# Patient Record
Sex: Female | Born: 1935 | Race: White | Hispanic: No | Smoking: Never smoker
Health system: Southern US, Community
[De-identification: ages and names within clinical notes are randomized; demographics above are authoritative.]

## PROBLEM LIST (undated history)

## (undated) DIAGNOSIS — E119 Type 2 diabetes mellitus without complications: Secondary | ICD-10-CM

## (undated) DIAGNOSIS — I2510 Atherosclerotic heart disease of native coronary artery without angina pectoris: Secondary | ICD-10-CM

## (undated) DIAGNOSIS — M1990 Unspecified osteoarthritis, unspecified site: Secondary | ICD-10-CM

## (undated) DIAGNOSIS — E079 Disorder of thyroid, unspecified: Secondary | ICD-10-CM

## (undated) DIAGNOSIS — I10 Essential (primary) hypertension: Secondary | ICD-10-CM

---

## 2013-08-02 ENCOUNTER — Emergency Department (HOSPITAL_COMMUNITY): Payer: Medicare Other

## 2013-08-02 ENCOUNTER — Emergency Department (HOSPITAL_COMMUNITY)
Admission: EM | Admit: 2013-08-02 | Discharge: 2013-08-02 | Disposition: A | Discharge status: Skilled Nursing Facility | Payer: Medicare Other | Attending: Emergency Medicine | Admitting: Emergency Medicine

## 2013-08-02 DIAGNOSIS — Y9210 Unspecified residential institution as the place of occurrence of the external cause: Secondary | ICD-10-CM | POA: Insufficient documentation

## 2013-08-02 DIAGNOSIS — W19XXXA Unspecified fall, initial encounter: Secondary | ICD-10-CM

## 2013-08-02 DIAGNOSIS — S01409A Unspecified open wound of unspecified cheek and temporomandibular area, initial encounter: Secondary | ICD-10-CM | POA: Insufficient documentation

## 2013-08-02 DIAGNOSIS — Y92129 Unspecified place in nursing home as the place of occurrence of the external cause: Secondary | ICD-10-CM

## 2013-08-02 DIAGNOSIS — W06XXXA Fall from bed, initial encounter: Secondary | ICD-10-CM | POA: Insufficient documentation

## 2013-08-02 DIAGNOSIS — Z7982 Long term (current) use of aspirin: Secondary | ICD-10-CM | POA: Insufficient documentation

## 2013-08-02 DIAGNOSIS — IMO0002 Reserved for concepts with insufficient information to code with codable children: Secondary | ICD-10-CM

## 2013-08-02 DIAGNOSIS — Y939 Activity, unspecified: Secondary | ICD-10-CM | POA: Insufficient documentation

## 2013-08-02 DIAGNOSIS — S51009A Unspecified open wound of unspecified elbow, initial encounter: Secondary | ICD-10-CM | POA: Insufficient documentation

## 2013-08-02 DIAGNOSIS — S50312A Abrasion of left elbow, initial encounter: Secondary | ICD-10-CM

## 2013-08-02 DIAGNOSIS — Z79899 Other long term (current) drug therapy: Secondary | ICD-10-CM | POA: Insufficient documentation

## 2013-08-02 DIAGNOSIS — Z794 Long term (current) use of insulin: Secondary | ICD-10-CM | POA: Insufficient documentation

## 2013-08-02 DIAGNOSIS — F0390 Unspecified dementia without behavioral disturbance: Secondary | ICD-10-CM | POA: Insufficient documentation

## 2013-08-02 NOTE — ED Provider Notes (Signed)
CSN: [id number]     Arrival date & time 08/02/13  0221 History   First MD Initiated Contact with Patient 08/02/13 0234     Chief Complaint  Patient presents with  . Fall   (Consider location/radiation/quality/duration/timing/severity/associated sxs/prior Treatment) HPI 80 yo female presents to the ER from nursing home via EMS with reported fall.  Pt reports sliding off the bed, striking her face on the way down, scraping her elbow.  No blood thinners, no reported LOC.  Patient has dementia, is a poor historian.  Patient has skin tear to right cheek abrasion to left elbow.  Family at the bedside, reports she is at her baseline. No past medical history on file. No past surgical history on file. No family history on file. History  Substance Use Topics  . Smoking status: Not on file  . Smokeless tobacco: Not on file  . Alcohol Use: Not on file   OB History   No data available     Review of Systems  Unable to perform ROS: Dementia    Allergies  Lodine  Home Medications   Current Outpatient Rx  Name  Route  Sig  Dispense  Refill  . Artificial Tear Ointment (LACRI-LUBE OP)   Ophthalmic   Apply 1 application to eye 4 (four) times daily. APPLY SMALL AMOUNT OF OINTMENT TO RIGHT EYE AND LOWER LID OF EYE 4 TIMES DAILY         . aspirin 81 MG chewable tablet   Oral   Chew 81 mg by mouth daily.         . carboxymethylcellulose (REFRESH TEARS) 0.5 % SOLN   Both Eyes   Place 1 drop into both eyes 2 (two) times daily.         . cholecalciferol (VITAMIN D) 1000 UNITS tablet   Oral   Take 1,000 Units by mouth daily.         . ciprofloxacin (CIPRO) 250 MG tablet   Oral   Take 250 mg by mouth 2 (two) times daily.         Marland Kitchen docusate sodium (COLACE) 100 MG capsule   Oral   Take 100 mg by mouth daily.         . feeding supplement, GLUCERNA SHAKE, (GLUCERNA SHAKE) LIQD   Oral   Take 237 mLs by mouth 2 (two) times daily between meals.         . insulin detemir  (LEVEMIR) 100 UNIT/ML injection   Subcutaneous   Inject 10 Units into the skin every evening.         Marland Kitchen levothyroxine (SYNTHROID, LEVOTHROID) 100 MCG tablet   Oral   Take 100 mcg by mouth daily before breakfast.         . lisinopril (PRINIVIL,ZESTRIL) 2.5 MG tablet   Oral   Take 2.5 mg by mouth daily.         Marland Kitchen LORazepam (ATIVAN) 0.5 MG tablet   Oral   Take 0.5 mg by mouth every 8 (eight) hours as needed for anxiety.         Marland Kitchen nystatin (MYCOSTATIN/NYSTOP) 100000 UNIT/GM POWD   Topical   Apply topically 2 (two) times daily. Apply to groin twice daily until healed         . risperiDONE (RISPERDAL) 0.25 MG tablet   Oral   Take 0.25 mg by mouth 2 (two) times daily.          BP 134/73  Pulse 75  Temp(Src) 98.3 F (  36.8 C) (Oral)  Resp 18  SpO2 97% Physical Exam  Nursing note and vitals reviewed. Constitutional: She appears well-developed and well-nourished. No distress.  HENT:  Head: Normocephalic.  Right Ear: External ear normal.  Left Ear: External ear normal.  Nose: Nose normal.  Mouth/Throat: Oropharynx is clear and moist.  3 cm, circular, skin tear to right cheek.  Bleeding is controlled  Eyes: Conjunctivae and EOM are normal. Pupils are equal, round, and reactive to light.  Neck: Normal range of motion. Neck supple. No JVD present. No tracheal deviation present. No thyromegaly present.  Cardiovascular: Normal rate, regular rhythm, normal heart sounds and intact distal pulses.  Exam reveals no gallop and no friction rub.   No murmur heard. Pulmonary/Chest: Effort normal and breath sounds normal. No stridor. No respiratory distress. She has no wheezes. She has no rales. She exhibits no tenderness.  Abdominal: Soft. Bowel sounds are normal. She exhibits no distension and no mass. There is no tenderness. There is no rebound and no guarding.  Musculoskeletal: Normal range of motion. She exhibits no edema and no tenderness.  Abrasion to left elbow.  Patient  noted to have multiple areas of bruising in various stages in ages  Lymphadenopathy:    She has no cervical adenopathy.  Neurological: She is alert. She has normal reflexes. No cranial nerve deficit. She exhibits normal muscle tone. Coordination normal.  Skin: Skin is warm and dry. No rash noted. No erythema. No pallor.  Psychiatric: She has a normal mood and affect. Her behavior is normal. Judgment and thought content normal.    ED Course  Procedures (including critical care time) Labs Review Labs Reviewed - No data to display Imaging Review Ct Head Wo Contrast  08/02/2013   CLINICAL DATA:  80 year old female status post fall with facial injury. Skin tear. Abrasion. Initial encounter.  EXAM: CT HEAD WITHOUT CONTRAST  CT CERVICAL SPINE WITHOUT CONTRAST  TECHNIQUE: Multidetector CT imaging of the head and cervical spine was performed following the standard protocol without intravenous contrast. Multiplanar CT image reconstructions of the cervical spine were also generated.  COMPARISON:  None.  FINDINGS: CT HEAD FINDINGS  No acute orbits soft tissue findings. No scalp hematoma identified. Calcified atherosclerosis at the skull base. Paranasal sinus periosteal thickening. Left mastoids are opacified. Negative visualized nasopharynx.  10 mm oval calcified probable small hemangioma at the right vertex. No associated mass effect. Dural calcifications elsewhere. Cerebral volume is within normal limits for age. No ventriculomegaly. No intracranial mass effect. Small chronic right thalamic lacunar infarct. No evidence of cortically based acute infarction identified. No suspicious intracranial vascular hyperdensity.  CT CERVICAL SPINE FINDINGS  Straightening and mild reversal of cervical lordosis. Multilevel mild spondylolisthesis in the cervical spine (e.g. C3-C4) is associated with widespread cervical facet degeneration. There is widespread ligament flavum calcification in the mid to lower cervical spine and  continuing into the upper thoracic spine. Subsequently, there is a degree of degenerative lower cervical spinal stenosis, appears to be mild.  Visualized skull base is intact. No atlanto-occipital dissociation. Bilateral posterior element alignment is within normal limits. Trace anterolisthesis of C7 on T1 with associated facet degeneration. Grossly intact visualized upper thoracic levels. Negative lung apices.  Calcified bilateral stylohyoid ligaments. Bulky bilateral carotid bifurcation calcified atherosclerosis. Otherwise negative visualized paraspinal soft tissues.  IMPRESSION: 1. Negative for age non contrast CT appearance of the brain; incidental small right convexity calcified meningioma. 2. No acute fracture or listhesis identified in the cervical spine. Ligamentous injury is not  excluded. 3. Multilevel degenerative changes. Multilevel lower cervical spinal stenosis, in part related to widespread calcified ligament flavum hypertrophy.   Electronically Signed   By: Augusto Gamble M.D.   On: 08/02/2013 04:00   Ct Cervical Spine Wo Contrast  08/02/2013   CLINICAL DATA:  80 year old female status post fall with facial injury. Skin tear. Abrasion. Initial encounter.  EXAM: CT HEAD WITHOUT CONTRAST  CT CERVICAL SPINE WITHOUT CONTRAST  TECHNIQUE: Multidetector CT imaging of the head and cervical spine was performed following the standard protocol without intravenous contrast. Multiplanar CT image reconstructions of the cervical spine were also generated.  COMPARISON:  None.  FINDINGS: CT HEAD FINDINGS  No acute orbits soft tissue findings. No scalp hematoma identified. Calcified atherosclerosis at the skull base. Paranasal sinus periosteal thickening. Left mastoids are opacified. Negative visualized nasopharynx.  10 mm oval calcified probable small hemangioma at the right vertex. No associated mass effect. Dural calcifications elsewhere. Cerebral volume is within normal limits for age. No ventriculomegaly. No  intracranial mass effect. Small chronic right thalamic lacunar infarct. No evidence of cortically based acute infarction identified. No suspicious intracranial vascular hyperdensity.  CT CERVICAL SPINE FINDINGS  Straightening and mild reversal of cervical lordosis. Multilevel mild spondylolisthesis in the cervical spine (e.g. C3-C4) is associated with widespread cervical facet degeneration. There is widespread ligament flavum calcification in the mid to lower cervical spine and continuing into the upper thoracic spine. Subsequently, there is a degree of degenerative lower cervical spinal stenosis, appears to be mild.  Visualized skull base is intact. No atlanto-occipital dissociation. Bilateral posterior element alignment is within normal limits. Trace anterolisthesis of C7 on T1 with associated facet degeneration. Grossly intact visualized upper thoracic levels. Negative lung apices.  Calcified bilateral stylohyoid ligaments. Bulky bilateral carotid bifurcation calcified atherosclerosis. Otherwise negative visualized paraspinal soft tissues.  IMPRESSION: 1. Negative for age non contrast CT appearance of the brain; incidental small right convexity calcified meningioma. 2. No acute fracture or listhesis identified in the cervical spine. Ligamentous injury is not excluded. 3. Multilevel degenerative changes. Multilevel lower cervical spinal stenosis, in part related to widespread calcified ligament flavum hypertrophy.   Electronically Signed   By: Augusto Gamble M.D.   On: 08/02/2013 04:00    EKG Interpretation    Date/Time:  Saturday August 02 2013 02:37:48 EST Ventricular Rate:  89 PR Interval:  190 QRS Duration: 84 QT Interval:  398 QTC Calculation: 484 R Axis:   -46 Text Interpretation:  Sinus rhythm LAD, consider left anterior fascicular block Anterior infarct, old Nonspecific T abnormalities, lateral leads No old tracing to compare Confirmed by Buell Parcel  MD, Rashard Ryle ([id number]) on 08/02/2013 3:24:52 AM             MDM   1. Fall at nursing home   2. Skin tear   3. Abrasion of left elbow     80 year old female status post fall, at her nursing home.  CT scans without acute injury.  Right cheek wound repaired with Steri-Strips.  Dressing applied to left elbow.  Patient is stable for discharge back to her nursing facility.    Olivia Mackie, MD 08/02/13 [phone]

## 2013-08-02 NOTE — Discharge Instructions (Signed)
Fall Prevention in Hospitals As a hospital patient, your condition and the treatments you receive can increase your risk for falls. Some additional risk factors for falls in a hospital include:  Being in an unfamiliar environment.  Being on bed rest.  Your surgery.  Taking certain medicines.  Your tubing requirements, such as intravenous (IV) therapy or catheters. It is important that you learn how to decrease fall risks while at the hospital. Below are important tips that can help prevent falls. SAFETY TIPS FOR PREVENTING FALLS Talk about your risk of falling.  Ask your caregiver why you are at risk for falling. Is it your medicine, illness, tubing placement, or something else?  Make a plan with your caregiver to keep you safe from falls.  Ask your caregiver or pharmacist about side effect of your medicines. Some medicines can make you dizzy or affect your coordination. Ask for help.  Ask for help before getting out of bed. You may need to press your call button.  Ask for assistance in getting you safely to the toilet.  Ask for a walker or cane to be put at your bedside. Ask that most of the side rails on your bed be placed up before your caregiver leaves the room.  Ask family or friends to sit with you.  Ask for things that are out of your reach, such as your glasses, hearing aids, telephone, bedside table, or call button. Follow these tips to avoid falling:  Stay lying or seated, rather than standing, while waiting for help.  Wear rubber-soled slippers or shoes whenever you walk in the hospital.  Avoid quick, sudden movements.  Change positions slowly.  Sit on the side of your bed before standing.  Stand up slowly and wait before you start to walk.  Let your caregiver know if there is a spill on the floor.  Pay careful attention to the medical equipment, electrical cords, and tubes around you.  When you need help, use your call button by your bed or in the  bathroom. Wait for one of your caregivers to help you.  If you feel dizzy or unsure of your footing, return to bed and wait for assistance.  Avoid being distracted by the TV, telephone, or another person in your room.  Do not lean or support yourself on rolling objects, such as IV poles or bedside tables. Document Released: 06/09/2000 Document Revised: 05/29/2012 Document Reviewed: 02/18/2012 [facility] Patient Information 2014 [place], [place].  Stitches, Staples, or Skin Adhesive Strips  Skin adhesive strips hold the skin together as it heals. They will usually be in place for 7 days or less. HOME CARE  Wash your hands with soap and water before and after you touch your wound.  Only take medicine as told by your doctor.  Cover your wound only if your doctor told you to. Otherwise, leave it open to air.  Skin adhesive strips will fall off by themselves.  Do not pick at the wound. Picking can cause an infection.  Do not miss your follow-up appointment.  If you have problems or questions, call your doctor. GET HELP RIGHT AWAY IF:   You have a temperature by mouth above 102 F (38.9 C), not controlled by medicine.  You have chills.  You have redness or pain around your stitches.  There is puffiness (swelling) around your stitches.  You notice fluid (drainage) from your stitches.  There is a bad smell coming from your wound. MAKE SURE YOU:  Understand these instructions.  Will watch your condition.  Will get help if you are not doing well or get worse. Document Released: 04/09/2009 Document Revised: 09/04/2011 Document Reviewed: 04/09/2009 [facility] Patient Information 2014 [place], [place].   Abrasion An abrasion is a cut or scrape of the skin. Abrasions do not extend through all layers of the skin and most heal within 10 days. It is important to care for your abrasion properly to prevent infection. CAUSES  Most abrasions are caused by falling on, or gliding across,  the ground or other surface. When your skin rubs on something, the outer and inner layer of skin rubs off, causing an abrasion. DIAGNOSIS  Your caregiver will be able to diagnose an abrasion during a physical exam.  TREATMENT  Your treatment depends on how large and deep the abrasion is. Generally, your abrasion will be cleaned with water and a mild soap to remove any dirt or debris. An antibiotic ointment may be put over the abrasion to prevent an infection. A bandage (dressing) may be wrapped around the abrasion to keep it from getting dirty.  You may need a tetanus shot if:  You cannot remember when you had your last tetanus shot.  You have never had a tetanus shot.  The injury broke your skin. If you get a tetanus shot, your arm may swell, get red, and feel warm to the touch. This is common and not a problem. If you need a tetanus shot and you choose not to have one, there is a rare chance of getting tetanus. Sickness from tetanus can be serious.  HOME CARE INSTRUCTIONS   If a dressing was applied, change it at least once a day or as directed by your caregiver. If the bandage sticks, soak it off with warm water.   Wash the area with water and a mild soap to remove all the ointment 2 times a day. Rinse off the soap and pat the area dry with a clean towel.   Reapply any ointment as directed by your caregiver. This will help prevent infection and keep the bandage from sticking. Use gauze over the wound and under the dressing to help keep the bandage from sticking.   Change your dressing right away if it becomes wet or dirty.   Only take over-the-counter or prescription medicines for pain, discomfort, or fever as directed by your caregiver.   Follow up with your caregiver within 24 48 hours for a wound check, or as directed. If you were not given a wound-check appointment, look closely at your abrasion for redness, swelling, or pus. These are signs of infection. SEEK IMMEDIATE MEDICAL  CARE IF:   You have increasing pain in the wound.   You have redness, swelling, or tenderness around the wound.   You have pus coming from the wound.   You have a fever or persistent symptoms for more than 2 3 days.  You have a fever and your symptoms suddenly get worse.  You have a bad smell coming from the wound or dressing.  MAKE SURE YOU:   Understand these instructions.  Will watch your condition.  Will get help right away if you are not doing well or get worse. Document Released: 03/22/2005 Document Revised: 05/29/2012 Document Reviewed: 05/16/2011 [facility] Patient Information 2014 [place], [place].

## 2013-08-02 NOTE — ED Notes (Signed)
Patient transported to CT 

## 2013-08-02 NOTE — ED Notes (Signed)
Bed: [id number] Expected date: 08/02/13 Expected time: 2:07 AM Means of arrival: Ambulance Comments: 80 yo F  Fall

## 2013-08-02 NOTE — ED Notes (Addendum)
Per EMS , pt. From an Assisted Living,reported of fall from her bed as she tried to reach out for her walker to go to the bathroom. Pt. Noted with skin tear on her right face, and  redness on her forehead . Pt. Is alert and oriented x2 ,not sure if she hit her head to the floor per report . No LOC reported. Checked for back injury, none noted.Denies of pain. Pt. Is a DNR.

## 2013-08-10 ENCOUNTER — Emergency Department (HOSPITAL_COMMUNITY): Payer: Medicare Other

## 2013-08-10 ENCOUNTER — Emergency Department (HOSPITAL_COMMUNITY)
Admission: EM | Admit: 2013-08-10 | Discharge: 2013-08-10 | Disposition: A | Discharge status: Home / Self Care | Payer: Medicare Other | Attending: Emergency Medicine | Admitting: Emergency Medicine

## 2013-08-10 ENCOUNTER — Encounter (HOSPITAL_COMMUNITY): Payer: Self-pay | Admitting: Emergency Medicine

## 2013-08-10 DIAGNOSIS — I2510 Atherosclerotic heart disease of native coronary artery without angina pectoris: Secondary | ICD-10-CM | POA: Insufficient documentation

## 2013-08-10 DIAGNOSIS — Z79899 Other long term (current) drug therapy: Secondary | ICD-10-CM | POA: Insufficient documentation

## 2013-08-10 DIAGNOSIS — M25519 Pain in unspecified shoulder: Secondary | ICD-10-CM | POA: Insufficient documentation

## 2013-08-10 DIAGNOSIS — S1093XA Contusion of unspecified part of neck, initial encounter: Principal | ICD-10-CM

## 2013-08-10 DIAGNOSIS — E079 Disorder of thyroid, unspecified: Secondary | ICD-10-CM | POA: Insufficient documentation

## 2013-08-10 DIAGNOSIS — Z7982 Long term (current) use of aspirin: Secondary | ICD-10-CM | POA: Insufficient documentation

## 2013-08-10 DIAGNOSIS — I10 Essential (primary) hypertension: Secondary | ICD-10-CM | POA: Insufficient documentation

## 2013-08-10 DIAGNOSIS — S0083XA Contusion of other part of head, initial encounter: Principal | ICD-10-CM

## 2013-08-10 DIAGNOSIS — W1809XA Striking against other object with subsequent fall, initial encounter: Secondary | ICD-10-CM | POA: Insufficient documentation

## 2013-08-10 DIAGNOSIS — S80212A Abrasion, left knee, initial encounter: Secondary | ICD-10-CM

## 2013-08-10 DIAGNOSIS — IMO0002 Reserved for concepts with insufficient information to code with codable children: Secondary | ICD-10-CM | POA: Insufficient documentation

## 2013-08-10 DIAGNOSIS — S0093XA Contusion of unspecified part of head, initial encounter: Secondary | ICD-10-CM

## 2013-08-10 DIAGNOSIS — Y9210 Unspecified residential institution as the place of occurrence of the external cause: Secondary | ICD-10-CM | POA: Insufficient documentation

## 2013-08-10 DIAGNOSIS — Z794 Long term (current) use of insulin: Secondary | ICD-10-CM | POA: Insufficient documentation

## 2013-08-10 DIAGNOSIS — S0003XA Contusion of scalp, initial encounter: Secondary | ICD-10-CM | POA: Insufficient documentation

## 2013-08-10 DIAGNOSIS — E119 Type 2 diabetes mellitus without complications: Secondary | ICD-10-CM | POA: Insufficient documentation

## 2013-08-10 DIAGNOSIS — W19XXXA Unspecified fall, initial encounter: Secondary | ICD-10-CM

## 2013-08-10 DIAGNOSIS — Y939 Activity, unspecified: Secondary | ICD-10-CM | POA: Insufficient documentation

## 2013-08-10 HISTORY — DX: Atherosclerotic heart disease of native coronary artery without angina pectoris: I25.10

## 2013-08-10 HISTORY — DX: Type 2 diabetes mellitus without complications: E11.9

## 2013-08-10 HISTORY — DX: Disorder of thyroid, unspecified: E07.9

## 2013-08-10 HISTORY — DX: Essential (primary) hypertension: I10

## 2013-08-10 LAB — URINALYSIS, ROUTINE W REFLEX MICROSCOPIC
Bilirubin Urine: NEGATIVE
GLUCOSE, UA: 250 mg/dL — AB
Ketones, ur: NEGATIVE mg/dL
Leukocytes, UA: NEGATIVE
Nitrite: NEGATIVE
PH: 6 (ref 5.0–8.0)
PROTEIN: NEGATIVE mg/dL
Specific Gravity, Urine: 1.017 (ref 1.005–1.030)
Urobilinogen, UA: 0.2 mg/dL (ref 0.0–1.0)

## 2013-08-10 LAB — URINE MICROSCOPIC-ADD ON

## 2013-08-10 NOTE — ED Provider Notes (Signed)
CSN: [id number]     Arrival date & time 08/10/13  1804 History   First MD Initiated Contact with Patient 08/10/13 1952     Chief Complaint  Patient presents with  . Fall     (Consider location/radiation/quality/duration/timing/severity/associated sxs/prior Treatment) Patient is a 80 y.o. female presenting with fall.  Fall This is a recurrent problem. The current episode started 12 to 24 hours ago. Episode frequency: twice. The problem has been resolved. Associated symptoms include headaches. Pertinent negatives include no chest pain, no abdominal pain and no shortness of breath. Nothing aggravates the symptoms. Nothing relieves the symptoms.    Past Medical History  Diagnosis Date  . Diabetes mellitus without complication   . Coronary artery disease   . Hypertension   . Thyroid disease    History reviewed. No pertinent past surgical history. No family history on file. History  Substance Use Topics  . Smoking status: Never Smoker   . Smokeless tobacco: Not on file  . Alcohol Use: No   OB History   Grav Para Term Preterm Abortions TAB SAB Ect Mult Living                 Review of Systems  Respiratory: Negative for shortness of breath.   Cardiovascular: Negative for chest pain.  Gastrointestinal: Negative for abdominal pain.  Neurological: Positive for headaches.  All other systems reviewed and are negative.      Allergies  Iodine and Lodine  Home Medications   Current Outpatient Rx  Name  Route  Sig  Dispense  Refill  . Artificial Tear Ointment (LACRI-LUBE OP)   Ophthalmic   Apply 1 application to eye 4 (four) times daily. APPLY SMALL AMOUNT OF OINTMENT TO RIGHT EYE AND LOWER LID OF EYE 4 TIMES DAILY         . aspirin 81 MG chewable tablet   Oral   Chew 81 mg by mouth daily.         . carboxymethylcellulose (REFRESH TEARS) 0.5 % SOLN   Both Eyes   Place 1 drop into both eyes 2 (two) times daily.         . cholecalciferol (VITAMIN D) 1000 UNITS  tablet   Oral   Take 1,000 Units by mouth daily.         Marland Kitchen docusate sodium (COLACE) 100 MG capsule   Oral   Take 100 mg by mouth daily.         . feeding supplement, GLUCERNA SHAKE, (GLUCERNA SHAKE) LIQD   Oral   Take 237 mLs by mouth 2 (two) times daily between meals.         . insulin detemir (LEVEMIR) 100 UNIT/ML injection   Subcutaneous   Inject 10 Units into the skin every evening.         Marland Kitchen levothyroxine (SYNTHROID, LEVOTHROID) 100 MCG tablet   Oral   Take 100 mcg by mouth daily before breakfast.         . lisinopril (PRINIVIL,ZESTRIL) 2.5 MG tablet   Oral   Take 2.5 mg by mouth daily.         Marland Kitchen LORazepam (ATIVAN) 0.5 MG tablet   Oral   Take 0.5 mg by mouth every 8 (eight) hours as needed for anxiety.         Marland Kitchen nystatin (MYCOSTATIN/NYSTOP) 100000 UNIT/GM POWD   Topical   Apply topically 2 (two) times daily. Apply to groin twice daily until healed         .  risperiDONE (RISPERDAL) 0.25 MG tablet   Oral   Take 0.25 mg by mouth 2 (two) times daily.          BP 129/41  Pulse 72  Temp(Src) 98.5 F (36.9 C) (Oral)  Resp 19  SpO2 98% Physical Exam  Nursing note and vitals reviewed. Constitutional: She is oriented to person, place, and time. She appears well-developed and well-nourished. No distress.  HENT:  Head: Normocephalic. Head is with contusion. Head is without raccoon's eyes and without Battle's sign.    Nose: Nose normal.  Eyes: Conjunctivae and EOM are normal. Pupils are equal, round, and reactive to light. No scleral icterus.  Neck: No spinous process tenderness and no muscular tenderness present.  Cardiovascular: Normal rate, regular rhythm, normal heart sounds and intact distal pulses.   No murmur heard. Pulmonary/Chest: Effort normal and breath sounds normal. She has no rales. She exhibits no tenderness.  Abdominal: Soft. There is no tenderness. There is no rebound and no guarding.  Musculoskeletal: Normal range of motion. She  exhibits no edema.       Left knee: She exhibits normal range of motion and no swelling. Tenderness found.       Thoracic back: She exhibits no tenderness and no bony tenderness.       Lumbar back: She exhibits no tenderness and no bony tenderness.  No evidence of trauma to extremities, except as noted.  2+ distal pulses.    Neurological: She is alert and oriented to person, place, and time.  Skin: Skin is warm and dry. No rash noted.  Psychiatric: She has a normal mood and affect.    ED Course  Procedures (including critical care time) Labs Review Labs Reviewed  URINALYSIS, ROUTINE W REFLEX MICROSCOPIC - Abnormal; Notable for the following:    Glucose, UA 250 (*)    Hgb urine dipstick MODERATE (*)    All other components within normal limits  URINE MICROSCOPIC-ADD ON - Abnormal; Notable for the following:    Squamous Epithelial / LPF FEW (*)    All other components within normal limits   Imaging Review Ct Head Wo Contrast  08/10/2013   CLINICAL DATA:  Fall.  Contusion.  EXAM: CT HEAD WITHOUT CONTRAST  CT CERVICAL SPINE WITHOUT CONTRAST  TECHNIQUE: Multidetector CT imaging of the head and cervical spine was performed following the standard protocol without intravenous contrast. Multiplanar CT image reconstructions of the cervical spine were also generated.  COMPARISON:  None.  FINDINGS: CT HEAD FINDINGS  There is a small contusion over the left frontal bone. The brain is atrophic with chronic microvascular ischemic change but no evidence of acute intracranial abnormality including hemorrhage, infarct, midline shift or abnormal extra-axial fluid collection is identified. 1.0 cm calcified meningioma over the right convexities again seen. The calvarium is intact.  CT CERVICAL SPINE FINDINGS  No fracture or malalignment cervical spine is identified. Scattered, mild degenerative change is noted. Lung apices are clear. Atherosclerosis noted.  IMPRESSION: Small soft tissue contusion over the left  frontal bone without underlying acute intracranial abnormality. No acute abnormality cervical spine.  Atrophy and chronic microvascular ischemic change. Small meningioma over the right frontal convexities, unchanged.  Mild appearing cervical spondylosis.   Electronically Signed   By: Drusilla Kanner M.D.   On: 08/10/2013 20:58   Ct Cervical Spine Wo Contrast  08/10/2013   CLINICAL DATA:  Fall.  Contusion.  EXAM: CT HEAD WITHOUT CONTRAST  CT CERVICAL SPINE WITHOUT CONTRAST  TECHNIQUE: Multidetector CT imaging of  the head and cervical spine was performed following the standard protocol without intravenous contrast. Multiplanar CT image reconstructions of the cervical spine were also generated.  COMPARISON:  None.  FINDINGS: CT HEAD FINDINGS  There is a small contusion over the left frontal bone. The brain is atrophic with chronic microvascular ischemic change but no evidence of acute intracranial abnormality including hemorrhage, infarct, midline shift or abnormal extra-axial fluid collection is identified. 1.0 cm calcified meningioma over the right convexities again seen. The calvarium is intact.  CT CERVICAL SPINE FINDINGS  No fracture or malalignment cervical spine is identified. Scattered, mild degenerative change is noted. Lung apices are clear. Atherosclerosis noted.  IMPRESSION: Small soft tissue contusion over the left frontal bone without underlying acute intracranial abnormality. No acute abnormality cervical spine.  Atrophy and chronic microvascular ischemic change. Small meningioma over the right frontal convexities, unchanged.  Mild appearing cervical spondylosis.   Electronically Signed   By: Drusilla Kannerhomas  Dalessio M.D.   On: 08/10/2013 20:58   Dg Knee Complete 4 Views Left  08/10/2013   CLINICAL DATA:  Fall, knee pain.  EXAM: LEFT KNEE - COMPLETE 4+ VIEW  COMPARISON:  None.  FINDINGS: Diffuse osteopenia. Vascular calcifications noted throughout the soft tissues. No acute bony abnormality. Specifically,  no fracture, subluxation, or dislocation. Soft tissues are intact. No joint effusion.  IMPRESSION: No acute bony abnormality.   Electronically Signed   By: Charlett NoseKevin  Dover M.D.   On: 08/10/2013 21:19  All radiology studies independently viewed by me.     EKG Interpretation    Date/Time:  Sunday August 10 2013 20:35:11 EST Ventricular Rate:  69 PR Interval:  197 QRS Duration: 87 QT Interval:  445 QTC Calculation: 477 R Axis:   -25 Text Interpretation:  Sinus rhythm Borderline left axis deviation Anterior infarct, old nonspecific t wave abnormalities No significant change was found Confirmed by [facility]OFFORD  MD, TREY ([id number]) on 08/10/2013 8:52:34 PM            MDM   Final diagnoses:  Fall  Head contusion  Abrasion of knee, left    80 yo female who had two mechanical falls last night.  Her daughter reports that she does not consistently use her walker.  She struck her head during both falls, but does not think she lost consciousness.  She presents to the ED now, some 20 hours after her second fall because her daughter noticed a large hematoma and her night nurse at her facility recommended she get checked out.  Other than the head injuries, she has a small abrasion to left knee.  She complains of some right shoulder pain, but this is apparently a chronic problem for her.  Plan CT head, C spine, and knee plain films.  Will check EKG and UA.    Imaging negative.  No signs of UTI on UA.  She ambulated with assistance (baseline).  DC'd back to ALF.  Candyce ChurnJohn David Danyah Guastella III, MD 08/11/13 [phone]

## 2013-08-10 NOTE — ED Notes (Addendum)
[facility] Pt fell today has a hematoma on left side of forehead. Pt alert per baseline ( answering questions appropriately). No HX of blood thinners. Pt normally ambulates with a walker but falls when she doesn't use it.

## 2013-12-27 ENCOUNTER — Emergency Department (HOSPITAL_BASED_OUTPATIENT_CLINIC_OR_DEPARTMENT_OTHER): Payer: Medicare Other

## 2013-12-27 ENCOUNTER — Encounter (HOSPITAL_BASED_OUTPATIENT_CLINIC_OR_DEPARTMENT_OTHER): Payer: Self-pay | Admitting: Emergency Medicine

## 2013-12-27 ENCOUNTER — Emergency Department (HOSPITAL_BASED_OUTPATIENT_CLINIC_OR_DEPARTMENT_OTHER)
Admission: EM | Admit: 2013-12-27 | Discharge: 2013-12-27 | Disposition: A | Discharge status: Home / Self Care | Payer: Medicare Other | Attending: Emergency Medicine | Admitting: Emergency Medicine

## 2013-12-27 DIAGNOSIS — S51009A Unspecified open wound of unspecified elbow, initial encounter: Secondary | ICD-10-CM | POA: Insufficient documentation

## 2013-12-27 DIAGNOSIS — I2510 Atherosclerotic heart disease of native coronary artery without angina pectoris: Secondary | ICD-10-CM | POA: Insufficient documentation

## 2013-12-27 DIAGNOSIS — E079 Disorder of thyroid, unspecified: Secondary | ICD-10-CM | POA: Insufficient documentation

## 2013-12-27 DIAGNOSIS — Z7982 Long term (current) use of aspirin: Secondary | ICD-10-CM | POA: Insufficient documentation

## 2013-12-27 DIAGNOSIS — S1093XA Contusion of unspecified part of neck, initial encounter: Secondary | ICD-10-CM

## 2013-12-27 DIAGNOSIS — S0003XA Contusion of scalp, initial encounter: Secondary | ICD-10-CM | POA: Insufficient documentation

## 2013-12-27 DIAGNOSIS — S51011A Laceration without foreign body of right elbow, initial encounter: Secondary | ICD-10-CM

## 2013-12-27 DIAGNOSIS — Z79899 Other long term (current) drug therapy: Secondary | ICD-10-CM | POA: Insufficient documentation

## 2013-12-27 DIAGNOSIS — I10 Essential (primary) hypertension: Secondary | ICD-10-CM | POA: Insufficient documentation

## 2013-12-27 DIAGNOSIS — Z794 Long term (current) use of insulin: Secondary | ICD-10-CM | POA: Insufficient documentation

## 2013-12-27 DIAGNOSIS — S0083XA Contusion of other part of head, initial encounter: Secondary | ICD-10-CM | POA: Insufficient documentation

## 2013-12-27 DIAGNOSIS — E119 Type 2 diabetes mellitus without complications: Secondary | ICD-10-CM | POA: Insufficient documentation
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
## 2013-12-27 NOTE — Discharge Instructions (Signed)
Contusion A contusion is a deep bruise. Contusions are the result of an injury that caused bleeding under the skin. The contusion may turn blue, purple, or yellow. Minor injuries will give you a painless contusion, but more severe contusions may stay painful and swollen for a few weeks.  CAUSES  A contusion is usually caused by a blow, trauma, or direct force to an area of the body. SYMPTOMS   Swelling and redness of the injured area.  Bruising of the injured area.  Tenderness and soreness of the injured area.  Pain. DIAGNOSIS  The diagnosis can be made by taking a history and physical exam. An X-ray, CT scan, or MRI may be needed to determine if there were any associated injuries, such as fractures. TREATMENT  Specific treatment will depend on what area of the body was injured. In general, the best treatment for a contusion is resting, icing, elevating, and applying cold compresses to the injured area. Over-the-counter medicines may also be recommended for pain control. Ask your caregiver what the best treatment is for your contusion. HOME CARE INSTRUCTIONS   Put ice on the injured area.  Put ice in a plastic bag.  Place a towel between your skin and the bag.  Leave the ice on for 15-20 minutes, 3-4 times a day, or as directed by your health care provider.  Only take over-the-counter or prescription medicines for pain, discomfort, or fever as directed by your caregiver. Your caregiver may recommend avoiding anti-inflammatory medicines (aspirin, ibuprofen, and naproxen) for 48 hours because these medicines may increase bruising.  Rest the injured area.  If possible, elevate the injured area to reduce swelling. SEEK IMMEDIATE MEDICAL CARE IF:   You have increased bruising or swelling.  You have pain that is getting worse.  Your swelling or pain is not relieved with medicines. MAKE SURE YOU:   Understand these instructions.  Will watch your condition.  Will get help right  away if you are not doing well or get worse. Document Released: 03/22/2005 Document Revised: 06/17/2013 Document Reviewed: 04/17/2011 [facility] Patient Information 2015 [place], [place]. This information is not intended to replace advice given to you by your health care provider. Make sure you discuss any questions you have with your health care provider.  Facial or Scalp Contusion  A facial or scalp contusion is a deep bruise on the face or head. Contusions happen when an injury causes bleeding under the skin. Signs of bruising include pain, puffiness (swelling), and discolored skin. The contusion may turn blue, purple, or yellow. HOME CARE  Only take medicines as told by your doctor.  Put ice on the injured area.  Put ice in a plastic bag.  Place a towel between your skin and the bag.  Leave the ice on for 20 minutes, 2-3 times a day. GET HELP IF:  You have bite problems.  You have pain when chewing.  You are worried about your face not healing normally. GET HELP RIGHT AWAY IF:   You have severe pain or a headache and medicine does not help.  You are very tired or confused, or your personality changes.  You throw up (vomit).  You have a nosebleed that will not stop.  You see two of everything (double vision) or have blurry vision.  You have fluid coming from your nose or ear.  You have problems walking or using your arms or legs. MAKE SURE YOU:   Understand these instructions.  Will watch your condition.  Will get  help right away if you are not doing well or get worse. Document Released: 06/01/2011 Document Revised: 04/02/2013 Document Reviewed: 01/23/2013 [facility] Patient Information 2015 [place], [place]. This information is not intended to replace advice given to you by your health care provider. Make sure you discuss any questions you have with your health care provider.  Laceration Care, Adult A laceration is a cut that goes through all layers of the skin. The  cut goes into the tissue beneath the skin. HOME CARE For stitches (sutures) or staples:  Keep the cut clean and dry.  If you have a bandage (dressing), change it at least once a day. Change the bandage if it gets wet or dirty, or as told by your doctor.  Wash the cut with soap and water 2 times a day. Rinse the cut with water. Pat it dry with a clean towel.  Put a thin layer of medicated cream on the cut as told by your doctor.  You may shower after the first 24 hours. Do not soak the cut in water until the stitches are removed.  Only take medicines as told by your doctor.  Have your stitches or staples removed as told by your doctor. For skin adhesive strips:  Keep the cut clean and dry.  Do not get the strips wet. You may take a bath, but be careful to keep the cut dry.  If the cut gets wet, pat it dry with a clean towel.  The strips will fall off on their own. Do not remove the strips that are still stuck to the cut. For wound glue:  You may shower or take baths. Do not soak or scrub the cut. Do not swim. Avoid heavy sweating until the glue falls off on its own. After a shower or bath, pat the cut dry with a clean towel.  Do not put medicine on your cut until the glue falls off.  If you have a bandage, do not put tape over the glue.  Avoid lots of sunlight or tanning lamps until the glue falls off. Put sunscreen on the cut for the first year to reduce your scar.  The glue will fall off on its own. Do not pick at the glue. You may need a tetanus shot if:  You cannot remember when you had your last tetanus shot.  You have never had a tetanus shot. If you need a tetanus shot and you choose not to have one, you may get tetanus. Sickness from tetanus can be serious. GET HELP RIGHT AWAY IF:   Your pain does not get better with medicine.  Your arm, hand, leg, or foot loses feeling (numbness) or changes color.  Your cut is bleeding.  Your joint feels weak, or you cannot  use your joint.  You have painful lumps on your body.  Your cut is red, puffy (swollen), or painful.  You have a red line on the skin near the cut.  You have yellowish-white fluid (pus) coming from the cut.  You have a fever.  You have a bad smell coming from the cut or bandage.  Your cut breaks open before or after stitches are removed.  You notice something coming out of the cut, such as wood or glass.  You cannot move a finger or toe. MAKE SURE YOU:   Understand these instructions.  Will watch your condition.  Will get help right away if you are not doing well or get worse. Document Released: 11/29/2007 Document Revised:  09/04/2011 Document Reviewed: 12/06/2010 ExitCare Patient Information 2015 [place], [place]. This information is not intended to replace advice given to you by your health care provider. Make sure you discuss any questions you have with your health care provider.

## 2013-12-27 NOTE — ED Notes (Signed)
Patient transported to CT via stretcher.
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
## 2013-12-31 ENCOUNTER — Emergency Department (HOSPITAL_BASED_OUTPATIENT_CLINIC_OR_DEPARTMENT_OTHER): Payer: Medicare Other

## 2013-12-31 ENCOUNTER — Encounter (HOSPITAL_BASED_OUTPATIENT_CLINIC_OR_DEPARTMENT_OTHER): Payer: Self-pay | Admitting: Emergency Medicine

## 2013-12-31 ENCOUNTER — Emergency Department (HOSPITAL_BASED_OUTPATIENT_CLINIC_OR_DEPARTMENT_OTHER)
Admission: EM | Admit: 2013-12-31 | Discharge: 2013-12-31 | Disposition: A | Discharge status: Home / Self Care | Payer: Medicare Other | Attending: Emergency Medicine | Admitting: Emergency Medicine

## 2013-12-31 DIAGNOSIS — Y9389 Activity, other specified: Secondary | ICD-10-CM | POA: Insufficient documentation

## 2013-12-31 DIAGNOSIS — E079 Disorder of thyroid, unspecified: Secondary | ICD-10-CM | POA: Insufficient documentation

## 2013-12-31 DIAGNOSIS — M25521 Pain in right elbow: Secondary | ICD-10-CM

## 2013-12-31 DIAGNOSIS — N390 Urinary tract infection, site not specified: Secondary | ICD-10-CM | POA: Insufficient documentation

## 2013-12-31 DIAGNOSIS — F0390 Unspecified dementia without behavioral disturbance: Secondary | ICD-10-CM | POA: Insufficient documentation

## 2013-12-31 DIAGNOSIS — S59919A Unspecified injury of unspecified forearm, initial encounter: Secondary | ICD-10-CM

## 2013-12-31 DIAGNOSIS — S79929A Unspecified injury of unspecified thigh, initial encounter: Secondary | ICD-10-CM

## 2013-12-31 DIAGNOSIS — S79919A Unspecified injury of unspecified hip, initial encounter: Secondary | ICD-10-CM | POA: Insufficient documentation

## 2013-12-31 DIAGNOSIS — S6990XA Unspecified injury of unspecified wrist, hand and finger(s), initial encounter: Secondary | ICD-10-CM

## 2013-12-31 DIAGNOSIS — E119 Type 2 diabetes mellitus without complications: Secondary | ICD-10-CM | POA: Insufficient documentation

## 2013-12-31 DIAGNOSIS — S0990XA Unspecified injury of head, initial encounter: Secondary | ICD-10-CM | POA: Insufficient documentation

## 2013-12-31 DIAGNOSIS — M25552 Pain in left hip: Secondary | ICD-10-CM

## 2013-12-31 DIAGNOSIS — Z79899 Other long term (current) drug therapy: Secondary | ICD-10-CM | POA: Insufficient documentation

## 2013-12-31 DIAGNOSIS — I10 Essential (primary) hypertension: Secondary | ICD-10-CM | POA: Insufficient documentation

## 2013-12-31 DIAGNOSIS — S59909A Unspecified injury of unspecified elbow, initial encounter: Secondary | ICD-10-CM | POA: Insufficient documentation

## 2013-12-31 DIAGNOSIS — W19XXXA Unspecified fall, initial encounter: Secondary | ICD-10-CM

## 2013-12-31 DIAGNOSIS — I2510 Atherosclerotic heart disease of native coronary artery without angina pectoris: Secondary | ICD-10-CM | POA: Insufficient documentation

## 2013-12-31 DIAGNOSIS — W010XXA Fall on same level from slipping, tripping and stumbling without subsequent striking against object, initial encounter: Secondary | ICD-10-CM | POA: Insufficient documentation

## 2013-12-31 DIAGNOSIS — Z794 Long term (current) use of insulin: Secondary | ICD-10-CM | POA: Insufficient documentation

## 2013-12-31 DIAGNOSIS — W06XXXA Fall from bed, initial encounter: Secondary | ICD-10-CM | POA: Insufficient documentation

## 2013-12-31 DIAGNOSIS — Y9210 Unspecified residential institution as the place of occurrence of the external cause: Secondary | ICD-10-CM | POA: Insufficient documentation

## 2013-12-31 LAB — URINALYSIS, ROUTINE W REFLEX MICROSCOPIC
BILIRUBIN URINE: NEGATIVE
Glucose, UA: NEGATIVE mg/dL
Hgb urine dipstick: NEGATIVE
KETONES UR: NEGATIVE mg/dL
NITRITE: NEGATIVE
Protein, ur: NEGATIVE mg/dL
Specific Gravity, Urine: 1.012 (ref 1.005–1.030)
Urobilinogen, UA: 0.2 mg/dL (ref 0.0–1.0)
pH: 7 (ref 5.0–8.0)

## 2013-12-31 LAB — URINE MICROSCOPIC-ADD ON

## 2013-12-31 MED ORDER — APAP 325 MG PO TABS: 650.0000 mg | ORAL_TABLET | Freq: Four times a day (QID) | ORAL | Status: AC | PRN

## 2013-12-31 MED ORDER — CEPHALEXIN 500 MG PO CAPS: 500.0000 mg | ORAL_CAPSULE | Freq: Two times a day (BID) | ORAL | Status: AC

## 2013-12-31 MED ORDER — ACETAMINOPHEN 500 MG PO TABS
1000.0000 mg | ORAL_TABLET | Freq: Once | ORAL | Status: AC
  Administered 2013-12-31: 1000 mg via ORAL
  Filled 2013-12-31: qty 2

## 2013-12-31 MED ORDER — CEPHALEXIN 250 MG PO CAPS
500.0000 mg | ORAL_CAPSULE | Freq: Once | ORAL | Status: AC
  Administered 2013-12-31: 500 mg via ORAL
  Filled 2013-12-31: qty 2

## 2013-12-31 NOTE — ED Notes (Signed)
Pt reports that she did not lose LOC. Reports pain to left hip and right elbow.

## 2013-12-31 NOTE — ED Notes (Signed)
[facility] [place], med tech.

## 2013-12-31 NOTE — ED Notes (Signed)
[facility] Pt found on floor by staff. Pt was found in urine. Pt has no complaints at this time. Unknown LOC.

## 2013-12-31 NOTE — Discharge Instructions (Signed)
Head Injury °You have received a head injury. It does not appear serious at this time. Headaches and vomiting are common following head injury. It should be easy to awaken from sleeping. Sometimes it is necessary for you to stay in the emergency department for a while for observation. Sometimes admission to the hospital may be needed. After injuries such as yours, most problems occur within the first 24 hours, but side effects may occur up to 7-10 days after the injury. It is important for you to carefully monitor your condition and contact your health care provider or seek immediate medical care if there is a change in your condition. °WHAT ARE THE TYPES OF HEAD INJURIES? °Head injuries can be as minor as a bump. Some head injuries can be more severe. More severe head injuries include: °· A jarring injury to the brain (concussion). °· A bruise of the brain (contusion). This mean there is bleeding in the brain that can cause swelling. °· A cracked skull (skull fracture). °· Bleeding in the brain that collects, clots, and forms a bump (hematoma). °WHAT CAUSES A HEAD INJURY? °A serious head injury is most likely to happen to someone who is in a car wreck and is not wearing a seat belt. Other causes of major head injuries include bicycle or motorcycle accidents, sports injuries, and falls. °HOW ARE HEAD INJURIES DIAGNOSED? °A complete history of the event leading to the injury and your current symptoms will be helpful in diagnosing head injuries. Many times, pictures of the brain, such as CT or MRI are needed to see the extent of the injury. Often, an overnight hospital stay is necessary for observation.  °WHEN SHOULD I SEEK IMMEDIATE MEDICAL CARE?  °You should get help right away if: °· You have confusion or drowsiness. °· You feel sick to your stomach (nauseous) or have continued, forceful vomiting. °· You have dizziness or unsteadiness that is getting worse. °· You have severe, continued headaches not relieved by  medicine. Only take over-the-counter or prescription medicines for pain, fever, or discomfort as directed by your health care provider. °· You do not have normal function of the arms or legs or are unable to walk. °· You notice changes in the black spots in the center of the colored part of your eye (pupil). °· You have a clear or bloody fluid coming from your nose or ears. °· You have a loss of vision. °During the next 24 hours after the injury, you must stay with someone who can watch you for the warning signs. This person should contact local emergency services (911 in the [place] if you have seizures, you become unconscious, or you are unable to wake up. °HOW CAN I PREVENT A HEAD INJURY IN THE FUTURE? °The most important factor for preventing major head injuries is avoiding motor vehicle accidents.  To minimize the potential for damage to your head, it is crucial to wear seat belts while riding in motor vehicles. Wearing helmets while bike riding and playing collision sports (like football) is also helpful. Also, avoiding dangerous activities around the house will further help reduce your risk of head injury.  °WHEN CAN I RETURN TO NORMAL ACTIVITIES AND ATHLETICS? °You should be reevaluated by your health care provider before returning to these activities. If you have any of the following symptoms, you should not return to activities or contact sports until 1 week after the symptoms have stopped: °· Persistent headache. °· Dizziness or vertigo. °· Poor attention and concentration. °· Confusion. °·   Memory problems. °· Nausea or vomiting. °· Fatigue or tire easily. °· Irritability. °· Intolerant of bright lights or loud noises. °· Anxiety or depression. °· Disturbed sleep. °MAKE SURE YOU:  °· Understand these instructions. °· Will watch your condition. °· Will get help right away if you are not doing well or get worse. °Document Released: 06/12/2005 Document Revised: 06/17/2013 Document Reviewed:  02/17/2013 °ExitCare® Patient Information ©2015 [employer]. This information is not intended to replace advice given to you by your health care provider. Make sure you discuss any questions you have with your health care provider. ° °Urinary Tract Infection °Urinary tract infections (UTIs) can develop anywhere along your urinary tract. Your urinary tract is your body's drainage system for removing wastes and extra water. Your urinary tract includes two kidneys, two ureters, a bladder, and a urethra. Your kidneys are a pair of bean-shaped organs. Each kidney is about the size of your fist. They are located below your ribs, one on each side of your spine. °CAUSES °Infections are caused by microbes, which are microscopic organisms, including fungi, viruses, and bacteria. These organisms are so small that they can only be seen through a microscope. Bacteria are the microbes that most commonly cause UTIs. °SYMPTOMS  °Symptoms of UTIs may vary by age and gender of the patient and by the location of the infection. Symptoms in young women typically include a frequent and intense urge to urinate and a painful, burning feeling in the bladder or urethra during urination. Older women and men are more likely to be tired, shaky, and weak and have muscle aches and abdominal pain. A fever may mean the infection is in your kidneys. Other symptoms of a kidney infection include pain in your back or sides below the ribs, nausea, and vomiting. °DIAGNOSIS °To diagnose a UTI, your caregiver will ask you about your symptoms. Your caregiver also will ask to provide a urine sample. The urine sample will be tested for bacteria and white blood cells. White blood cells are made by your body to help fight infection. °TREATMENT  °Typically, UTIs can be treated with medication. Because most UTIs are caused by a bacterial infection, they usually can be treated with the use of antibiotics. The choice of antibiotic and length of treatment depend  on your symptoms and the type of bacteria causing your infection. °HOME CARE INSTRUCTIONS °· If you were prescribed antibiotics, take them exactly as your caregiver instructs you. Finish the medication even if you feel better after you have only taken some of the medication. °· Drink enough water and fluids to keep your urine clear or pale yellow. °· Avoid caffeine, tea, and carbonated beverages. They tend to irritate your bladder. °· Empty your bladder often. Avoid holding urine for long periods of time. °· Empty your bladder before and after sexual intercourse. °· After a bowel movement, women should cleanse from front to back. Use each tissue only once. °SEEK MEDICAL CARE IF:  °· You have back pain. °· You develop a fever. °· Your symptoms do not begin to resolve within 3 days. °SEEK IMMEDIATE MEDICAL CARE IF:  °· You have severe back pain or lower abdominal pain. °· You develop chills. °· You have nausea or vomiting. °· You have continued burning or discomfort with urination. °MAKE SURE YOU:  °· Understand these instructions. °· Will watch your condition. °· Will get help right away if you are not doing well or get worse. °Document Released: 03/22/2005 Document Revised: 12/12/2011 Document Reviewed: 07/21/2011 °  ExitCare® Patient Information ©2015 [employer]. This information is not intended to replace advice given to you by your health care provider. Make sure you discuss any questions you have with your health care provider. ° °

## 2013-12-31 NOTE — ED Notes (Signed)
Pt ambulated well around the hall with assistance of staff.
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
## 2013-12-31 NOTE — ED Notes (Signed)
Pt is from [facility].

## 2014-01-02 LAB — URINE CULTURE: Colony Count: >=100000

## 2014-01-06 ENCOUNTER — Emergency Department (HOSPITAL_BASED_OUTPATIENT_CLINIC_OR_DEPARTMENT_OTHER)
Admission: EM | Admit: 2014-01-06 | Discharge: 2014-01-06 | Disposition: A | Discharge status: Home / Self Care | Payer: Medicare Other | Attending: Emergency Medicine | Admitting: Emergency Medicine

## 2014-01-06 ENCOUNTER — Encounter (HOSPITAL_BASED_OUTPATIENT_CLINIC_OR_DEPARTMENT_OTHER): Payer: Self-pay | Admitting: Emergency Medicine

## 2014-01-06 ENCOUNTER — Emergency Department (HOSPITAL_BASED_OUTPATIENT_CLINIC_OR_DEPARTMENT_OTHER): Payer: Medicare Other

## 2014-01-06 DIAGNOSIS — Y9389 Activity, other specified: Secondary | ICD-10-CM | POA: Insufficient documentation

## 2014-01-06 DIAGNOSIS — R296 Repeated falls: Secondary | ICD-10-CM | POA: Insufficient documentation

## 2014-01-06 DIAGNOSIS — Z79899 Other long term (current) drug therapy: Secondary | ICD-10-CM | POA: Insufficient documentation

## 2014-01-06 DIAGNOSIS — S0083XA Contusion of other part of head, initial encounter: Principal | ICD-10-CM | POA: Insufficient documentation

## 2014-01-06 DIAGNOSIS — S0003XA Contusion of scalp, initial encounter: Secondary | ICD-10-CM | POA: Insufficient documentation

## 2014-01-06 DIAGNOSIS — Z792 Long term (current) use of antibiotics: Secondary | ICD-10-CM | POA: Insufficient documentation

## 2014-01-06 DIAGNOSIS — S1093XA Contusion of unspecified part of neck, initial encounter: Principal | ICD-10-CM

## 2014-01-06 DIAGNOSIS — Z794 Long term (current) use of insulin: Secondary | ICD-10-CM | POA: Insufficient documentation

## 2014-01-06 DIAGNOSIS — I10 Essential (primary) hypertension: Secondary | ICD-10-CM | POA: Insufficient documentation

## 2014-01-06 DIAGNOSIS — E079 Disorder of thyroid, unspecified: Secondary | ICD-10-CM | POA: Insufficient documentation

## 2014-01-06 DIAGNOSIS — Y9289 Other specified places as the place of occurrence of the external cause: Secondary | ICD-10-CM | POA: Insufficient documentation

## 2014-01-06 DIAGNOSIS — I2510 Atherosclerotic heart disease of native coronary artery without angina pectoris: Secondary | ICD-10-CM | POA: Insufficient documentation

## 2014-01-06 DIAGNOSIS — Z7982 Long term (current) use of aspirin: Secondary | ICD-10-CM | POA: Insufficient documentation

## 2014-01-06 DIAGNOSIS — E119 Type 2 diabetes mellitus without complications: Secondary | ICD-10-CM | POA: Insufficient documentation

## 2014-01-06 NOTE — ED Notes (Signed)
Pt sent from [facility] after being found on floor by staff of facility . Hematoma to crown of head and skin tear to left hand

## 2014-01-06 NOTE — ED Notes (Signed)
Radiology called for transport

## 2014-01-06 NOTE — ED Provider Notes (Signed)
CSN: [id number]     Arrival date & time 01/06/14  0547 History   None    Chief Complaint  Patient presents with  . Fall     (Consider location/radiation/quality/duration/timing/severity/associated sxs/prior Treatment) Patient is a 80 y.o. female presenting with fall. The history is provided by the EMS personnel. The history is limited by the condition of the patient (level 5 dementia).  Fall This is a new problem. Episode onset: unknown. The problem occurs constantly. The problem has not changed since onset.Pertinent negatives include no chest pain, no abdominal pain, no headaches and no shortness of breath. Nothing aggravates the symptoms. Nothing relieves the symptoms. She has tried nothing for the symptoms. The treatment provided no relief.    Past Medical History  Diagnosis Date  . Diabetes mellitus without complication   . Coronary artery disease   . Hypertension   . Thyroid disease    No past surgical history on file. No family history on file. History  Substance Use Topics  . Smoking status: Never Smoker   . Smokeless tobacco: Not on file  . Alcohol Use: No   OB History   Grav Para Term Preterm Abortions TAB SAB Ect Mult Living                 Review of Systems  Unable to perform ROS Respiratory: Negative for shortness of breath.   Cardiovascular: Negative for chest pain.  Gastrointestinal: Negative for abdominal pain.  Neurological: Negative for headaches.      Allergies  Iodine and Lodine  Home Medications   Prior to Admission medications   Medication Sig Start Date End Date Taking? Authorizing Provider  acetaminophen 325 MG tablet Take 2 tablets (650 mg total) by mouth every 6 (six) hours as needed for mild pain. 12/31/13   Kristen N Ward, DO  Artificial Tear Ointment (LACRI-LUBE OP) Apply 1 application to eye 4 (four) times daily. APPLY SMALL AMOUNT OF OINTMENT TO RIGHT EYE AND LOWER LID OF EYE 4 TIMES DAILY    Historical Provider, MD  aspirin 81 MG  chewable tablet Chew 81 mg by mouth daily.    Historical Provider, MD  calcium-vitamin D (OSCAL WITH D) 500-200 MG-UNIT per tablet Take 1 tablet by mouth.    Historical Provider, MD  carboxymethylcellulose (REFRESH TEARS) 0.5 % SOLN Place 1 drop into both eyes 2 (two) times daily.    Historical Provider, MD  cephALEXin (KEFLEX) 500 MG capsule Take 1 capsule (500 mg total) by mouth 2 (two) times daily. 12/31/13   Kristen N Ward, DO  cholecalciferol (VITAMIN D) 1000 UNITS tablet Take 1,000 Units by mouth daily.    Historical Provider, MD  docusate sodium (COLACE) 100 MG capsule Take 100 mg by mouth daily.    Historical Provider, MD  feeding supplement, GLUCERNA SHAKE, (GLUCERNA SHAKE) LIQD Take 237 mLs by mouth 2 (two) times daily between meals.    Historical Provider, MD  insulin detemir (LEVEMIR) 100 UNIT/ML injection Inject 10 Units into the skin every evening.    Historical Provider, MD  levothyroxine (SYNTHROID, LEVOTHROID) 100 MCG tablet Take 100 mcg by mouth daily before breakfast.    Historical Provider, MD  lisinopril (PRINIVIL,ZESTRIL) 2.5 MG tablet Take 2.5 mg by mouth daily.    Historical Provider, MD  nystatin (MYCOSTATIN/NYSTOP) 100000 UNIT/GM POWD Apply topically 2 (two) times daily. Apply to groin twice daily until healed    Historical Provider, MD  risperiDONE (RISPERDAL) 0.25 MG tablet Take 0.25 mg by mouth 2 (two)  times daily.    Historical Provider, MD  risperiDONE (RISPERDAL) 0.5 MG tablet Take 0.5 mg by mouth every 8 (eight) hours as needed (Behavior).    Historical Provider, MD   There were no vitals taken for this visit. Physical Exam  Constitutional: She appears well-developed and well-nourished. No distress.  HENT:  Head: Normocephalic. Head is without raccoon's eyes and without Battle's sign.  Right Ear: No hemotympanum.  Left Ear: No hemotympanum.  Mouth/Throat: No oropharyngeal exudate.  Cephalohematoma posterior head  Eyes: Conjunctivae are normal. Pupils are equal,  round, and reactive to light.  Neck: Normal range of motion. Neck supple.  Cardiovascular: Normal rate, regular rhythm and intact distal pulses.   Pulmonary/Chest: Effort normal and breath sounds normal. She has no wheezes. She has no rales.  Abdominal: Soft. Bowel sounds are normal. There is no tenderness. There is no rebound and no guarding.  Pelvis stable  Musculoskeletal: Normal range of motion.  Neurological: She is alert. She has normal reflexes.  Skin: Skin is warm and dry.     Psychiatric: She has a normal mood and affect.    ED Course  Procedures (including critical care time) Labs Review Labs Reviewed - No data to display  Imaging Review No results found.   EKG Interpretation None      MDM   Final diagnoses:  None    Cephalohematoma, follow up with your family doctor for ongoing care    Kendale Rembold K Joeph Szatkowski-Rasch, MD 01/06/14 [phone]

## 2014-01-22 ENCOUNTER — Emergency Department (HOSPITAL_BASED_OUTPATIENT_CLINIC_OR_DEPARTMENT_OTHER): Payer: Medicare Other

## 2014-01-22 ENCOUNTER — Encounter (HOSPITAL_BASED_OUTPATIENT_CLINIC_OR_DEPARTMENT_OTHER): Payer: Self-pay | Admitting: Emergency Medicine

## 2014-01-22 ENCOUNTER — Emergency Department (HOSPITAL_BASED_OUTPATIENT_CLINIC_OR_DEPARTMENT_OTHER)
Admission: EM | Admit: 2014-01-22 | Discharge: 2014-01-22 | Disposition: A | Discharge status: Skilled Nursing Facility | Payer: Medicare Other | Attending: Emergency Medicine | Admitting: Emergency Medicine

## 2014-01-22 DIAGNOSIS — Y9389 Activity, other specified: Secondary | ICD-10-CM | POA: Insufficient documentation

## 2014-01-22 DIAGNOSIS — I10 Essential (primary) hypertension: Secondary | ICD-10-CM | POA: Insufficient documentation

## 2014-01-22 DIAGNOSIS — Y929 Unspecified place or not applicable: Secondary | ICD-10-CM | POA: Diagnosis not present

## 2014-01-22 DIAGNOSIS — S72111A Displaced fracture of greater trochanter of right femur, initial encounter for closed fracture: Secondary | ICD-10-CM

## 2014-01-22 DIAGNOSIS — S41109A Unspecified open wound of unspecified upper arm, initial encounter: Secondary | ICD-10-CM | POA: Diagnosis not present

## 2014-01-22 DIAGNOSIS — S99919A Unspecified injury of unspecified ankle, initial encounter: Secondary | ICD-10-CM

## 2014-01-22 DIAGNOSIS — E119 Type 2 diabetes mellitus without complications: Secondary | ICD-10-CM | POA: Insufficient documentation

## 2014-01-22 DIAGNOSIS — Z7982 Long term (current) use of aspirin: Secondary | ICD-10-CM | POA: Diagnosis not present

## 2014-01-22 DIAGNOSIS — S0083XA Contusion of other part of head, initial encounter: Secondary | ICD-10-CM | POA: Insufficient documentation

## 2014-01-22 DIAGNOSIS — S72109A Unspecified trochanteric fracture of unspecified femur, initial encounter for closed fracture: Secondary | ICD-10-CM | POA: Insufficient documentation

## 2014-01-22 DIAGNOSIS — S41111A Laceration without foreign body of right upper arm, initial encounter: Secondary | ICD-10-CM

## 2014-01-22 DIAGNOSIS — S79919A Unspecified injury of unspecified hip, initial encounter: Secondary | ICD-10-CM | POA: Diagnosis not present

## 2014-01-22 DIAGNOSIS — E079 Disorder of thyroid, unspecified: Secondary | ICD-10-CM | POA: Insufficient documentation

## 2014-01-22 DIAGNOSIS — S51009A Unspecified open wound of unspecified elbow, initial encounter: Secondary | ICD-10-CM | POA: Diagnosis not present

## 2014-01-22 DIAGNOSIS — I2510 Atherosclerotic heart disease of native coronary artery without angina pectoris: Secondary | ICD-10-CM | POA: Insufficient documentation

## 2014-01-22 DIAGNOSIS — S0003XA Contusion of scalp, initial encounter: Secondary | ICD-10-CM | POA: Insufficient documentation

## 2014-01-22 DIAGNOSIS — S1093XA Contusion of unspecified part of neck, initial encounter: Secondary | ICD-10-CM

## 2014-01-22 DIAGNOSIS — Z794 Long term (current) use of insulin: Secondary | ICD-10-CM | POA: Insufficient documentation

## 2014-01-22 DIAGNOSIS — S8990XA Unspecified injury of unspecified lower leg, initial encounter: Secondary | ICD-10-CM | POA: Diagnosis present

## 2014-01-22 DIAGNOSIS — S99929A Unspecified injury of unspecified foot, initial encounter: Secondary | ICD-10-CM

## 2014-01-22 DIAGNOSIS — Z9181 History of falling: Secondary | ICD-10-CM | POA: Insufficient documentation

## 2014-01-22 DIAGNOSIS — R296 Repeated falls: Secondary | ICD-10-CM | POA: Insufficient documentation

## 2014-01-22 DIAGNOSIS — S79929A Unspecified injury of unspecified thigh, initial encounter: Secondary | ICD-10-CM

## 2014-01-22 DIAGNOSIS — S51011A Laceration without foreign body of right elbow, initial encounter: Secondary | ICD-10-CM

## 2014-01-22 HISTORY — DX: Unspecified osteoarthritis, unspecified site: M19.90

## 2014-01-22 NOTE — Discharge Instructions (Signed)
You have a nondisplaced (meaning the bone fragments are not out of place) fracture (break) of the greater trochanter (the part of the hip that sticks out to the side) of the right hip. This fracture does not require surgery or hospitalization. Treatment is with weightbearing as tolerated; it is recommended you use a wheelchair to get around, and use your left leg to pivot in and out of bed and on and off the toilet.

## 2014-01-22 NOTE — ED Notes (Signed)
[facility] at bedside, report given, pt and daughter verbalize understanding of plan of care and follow up with dr. Lajoyce Cornersuda.

## 2014-01-22 NOTE — ED Notes (Signed)
[facility]. [place], ns has called [facility] for transport. Daughter and pt updated on plan of care.
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
## 2014-01-22 NOTE — ED Provider Notes (Signed)
CSN: [id number]     Arrival date & time 01/22/14  0615 History   First MD Initiated Contact with Patient 01/22/14 [phone]     Chief Complaint  Patient presents with  . Fall     (Consider location/radiation/quality/duration/timing/severity/associated sxs/prior Treatment) HPI Level 5 Caveat: dementia. This is a 80 year old female nursing home resident with a history of recent falls. She is here after being found on the ground presumably falling from a standing position. She is noted to have skin tears to her right upper arm and elbow. She is complaining of pain in her right hip and weightbearing although she is able to bear weight. She has no acute head injury. She has a hematoma to her right occiput do to a prior fall. She is on Coumadin for possible DVT. She is at her mental baseline per nursing home staff.  Past Medical History  Diagnosis Date  . Diabetes mellitus without complication   . Coronary artery disease   . Hypertension   . Thyroid disease    No past surgical history on file. No family history on file. History  Substance Use Topics  . Smoking status: Never Smoker   . Smokeless tobacco: Not on file  . Alcohol Use: No   OB History   Grav Para Term Preterm Abortions TAB SAB Ect Mult Living                 Review of Systems  Unable to perform ROS   Allergies  Iodine and Lodine  Home Medications   Prior to Admission medications   Medication Sig Start Date End Date Taking? Authorizing Provider  acetaminophen 325 MG tablet Take 2 tablets (650 mg total) by mouth every 6 (six) hours as needed for mild pain. 12/31/13   Kristen N Ward, DO  Artificial Tear Ointment (LACRI-LUBE OP) Apply 1 application to eye 4 (four) times daily. APPLY SMALL AMOUNT OF OINTMENT TO RIGHT EYE AND LOWER LID OF EYE 4 TIMES DAILY    Historical Provider, MD  aspirin 81 MG chewable tablet Chew 81 mg by mouth daily.    Historical Provider, MD  calcium-vitamin D (OSCAL WITH D) 500-200 MG-UNIT per tablet  Take 1 tablet by mouth.    Historical Provider, MD  carboxymethylcellulose (REFRESH TEARS) 0.5 % SOLN Place 1 drop into both eyes 2 (two) times daily.    Historical Provider, MD  cephALEXin (KEFLEX) 500 MG capsule Take 1 capsule (500 mg total) by mouth 2 (two) times daily. 12/31/13   Kristen N Ward, DO  cholecalciferol (VITAMIN D) 1000 UNITS tablet Take 1,000 Units by mouth daily.    Historical Provider, MD  docusate sodium (COLACE) 100 MG capsule Take 100 mg by mouth daily.    Historical Provider, MD  feeding supplement, GLUCERNA SHAKE, (GLUCERNA SHAKE) LIQD Take 237 mLs by mouth 2 (two) times daily between meals.    Historical Provider, MD  insulin detemir (LEVEMIR) 100 UNIT/ML injection Inject 10 Units into the skin every evening.    Historical Provider, MD  levothyroxine (SYNTHROID, LEVOTHROID) 100 MCG tablet Take 100 mcg by mouth daily before breakfast.    Historical Provider, MD  lisinopril (PRINIVIL,ZESTRIL) 2.5 MG tablet Take 2.5 mg by mouth daily.    Historical Provider, MD  nystatin (MYCOSTATIN/NYSTOP) 100000 UNIT/GM POWD Apply topically 2 (two) times daily. Apply to groin twice daily until healed    Historical Provider, MD  risperiDONE (RISPERDAL) 0.25 MG tablet Take 0.25 mg by mouth 2 (two) times daily.  Historical Provider, MD  risperiDONE (RISPERDAL) 0.5 MG tablet Take 0.5 mg by mouth every 8 (eight) hours as needed (Behavior).    Historical Provider, MD   BP 128/72  Pulse 88  Temp(Src) 97.8 F (36.6 C) (Oral)  Resp 16  Wt 160 lb (72.576 kg)  SpO2 98%  Physical Exam General: Well-developed, well-nourished female in no acute distress; appearance consistent with age of record HENT: normocephalic; healing hematoma to right occiput Eyes: pupils equal, round and reactive to light; extraocular muscles intact; ectropion of right lower lid Neck: supple; nontender Heart: regular rate and rhythm Lungs: clear to auscultation bilaterally Abdomen: soft; nondistended; nontender; bowel  sounds present Extremities: No acute deformity; arthritic changes; edema of the left lower leg with several bandaged wounds of the left ankle and foot; pain in right hip on weightbearing and ambulation without shortening or rotation of the right lower extremity; tenderness over right greater trochanter Neurologic: Awake, alert, confused; able to answer simple questions motor function intact in all extremities and symmetric; no facial droop Skin: Warm and dry; skin tears of right upper arm and right elbow; healing abrasions of left hand     ED Course  Procedures (including critical care time)  MDM  Nursing notes and vitals signs, including pulse oximetry, reviewed.  Summary of this visit's results, reviewed by myself:  Imaging Studies: Dg Hip Complete Right  01/22/2014   CLINICAL DATA:  Right hip pain after a fall.  EXAM: RIGHT HIP - COMPLETE 2+ VIEW  COMPARISON:  None.  FINDINGS: Nondisplaced fracture of the greater trochanteric of the proximal right femur. No dislocation of the right hip. Pelvis and left hip appear intact. Degenerative changes in the lower lumbar spine. Vascular calcifications.  IMPRESSION: Nondisplaced fracture of the greater trochanter of the right hip.   Electronically Signed   By: Burman NievesWilliam  Stevens M.D.   On: 01/22/2014 06:49   7:06 AM Patient's x-ray findings discussed with the patient's daughter. She was advised that the patient is weightbearing only as tolerated and this is a nonsurgical fracture for which hospitalization is indicated. Her skin tears have been bandaged. The patient's daughter intends to discuss continued Coumadin treatment with her mother's PCP.     Hanley SeamenJohn L Deidrea Gaetz, MD 01/22/14 [phone]

## 2014-01-22 NOTE — ED Notes (Signed)
[facility] has been notified of transfer back

## 2014-01-22 NOTE — ED Notes (Signed)
Pt fell from standing,  C/o rt leg pain,  Skin tears to rt arm,  Abrasion to left elbow  No loc

## 2014-02-24 DEATH — deceased

## 2014-12-17 IMAGING — CT CT CERVICAL SPINE W/O CM
2 of 4 series · 7 of 14 positions shown, 8 images · non-contrast
Comparison: None.

CLINICAL DATA: Fall.  Contusion.

EXAM:
CT HEAD WITHOUT CONTRAST
CT CERVICAL SPINE WITHOUT CONTRAST
TECHNIQUE: Multidetector CT imaging of the head and cervical spine was
performed following the standard protocol without intravenous
contrast. Multiplanar CT image reconstructions of the cervical spine
were also generated.

[Series 3: c-spine st · axial · 0.29mm/px · z∈[-261,-143]mm · 4 of 99 slices shown, 5 images]
[im 20/99  soft-tissue]
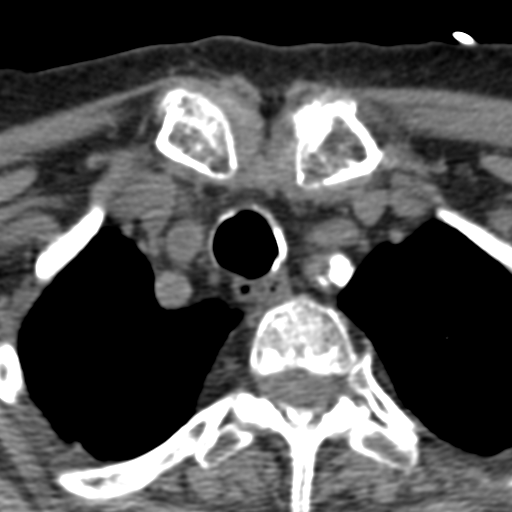
[im 20/99  bone]
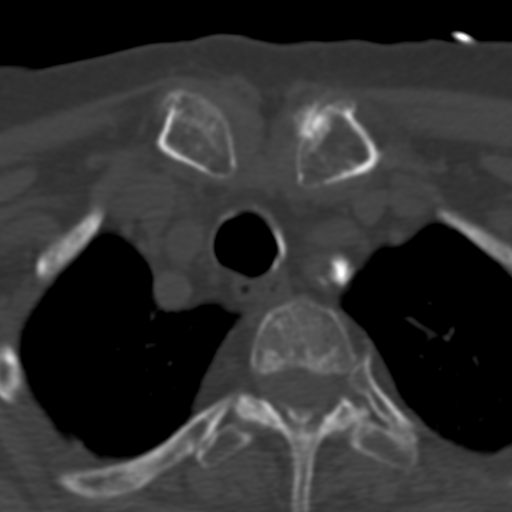
[im 40/99  bone]
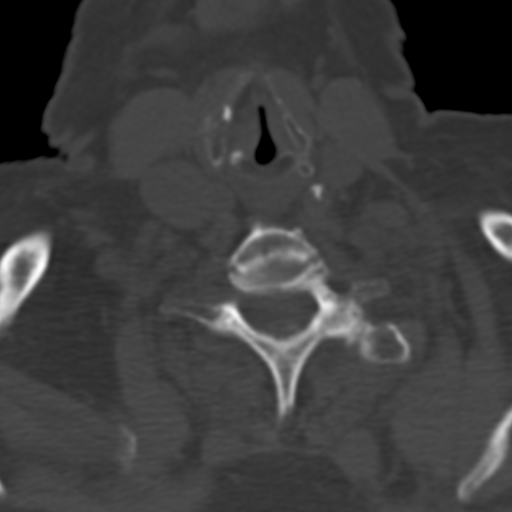
[im 59/99  bone]
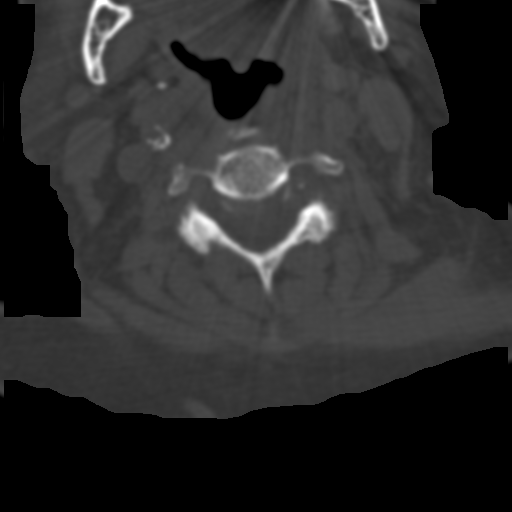
[im 79/99  bone]
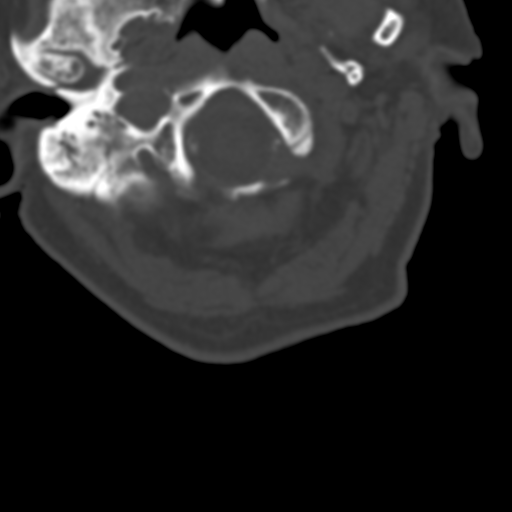

[Series 6: axial reformats · axial · 0.23mm/px · z∈[-273,-183]mm · 3 of 98 slices shown]
[im 25/98  bone]
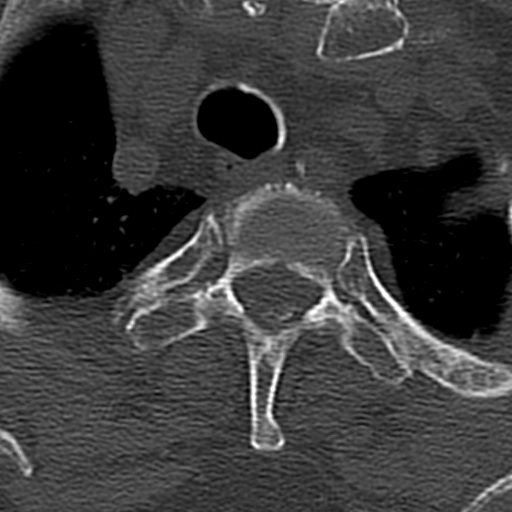
[im 49/98  bone]
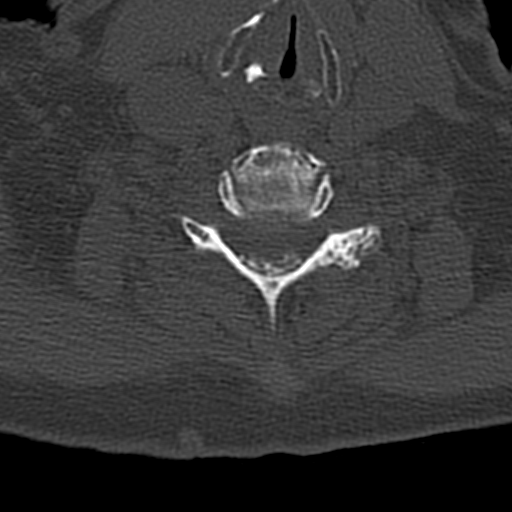
[im 73/98  bone]
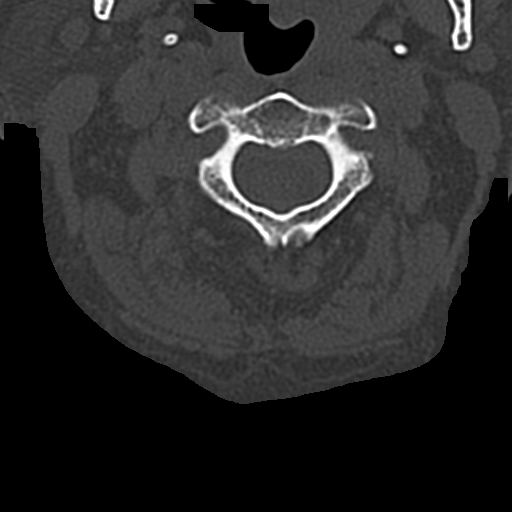

[7 of 14 positions shown; findings below may reference images not displayed]

FINDINGS: CT HEAD FINDINGS

There is a small contusion over the left frontal bone. The brain is
atrophic with chronic microvascular ischemic change but no evidence
of acute intracranial abnormality including hemorrhage, infarct,
midline shift or abnormal extra-axial fluid collection is
identified. 1.0 cm calcified meningioma over the right convexities
again seen. The calvarium is intact.

CT CERVICAL SPINE FINDINGS

No fracture or malalignment cervical spine is identified. Scattered,
mild degenerative change is noted. Lung apices are clear.
Atherosclerosis noted.
IMPRESSION: Small soft tissue contusion over the left frontal bone without
underlying acute intracranial abnormality. No acute abnormality
cervical spine.

Atrophy and chronic microvascular ischemic change. Small meningioma
over the right frontal convexities, unchanged.

Mild appearing cervical spondylosis.

## 2015-05-09 IMAGING — CR DG HIP (WITH OR WITHOUT PELVIS) 2-3V*L*
3 series · 3 of 3 positions shown · non-contrast
Comparison: None.

CLINICAL DATA: Fall, posterior laceration.  Left hip pain.

EXAM:
LEFT HIP - COMPLETE 2+ VIEW

[t pelvis a.p.]
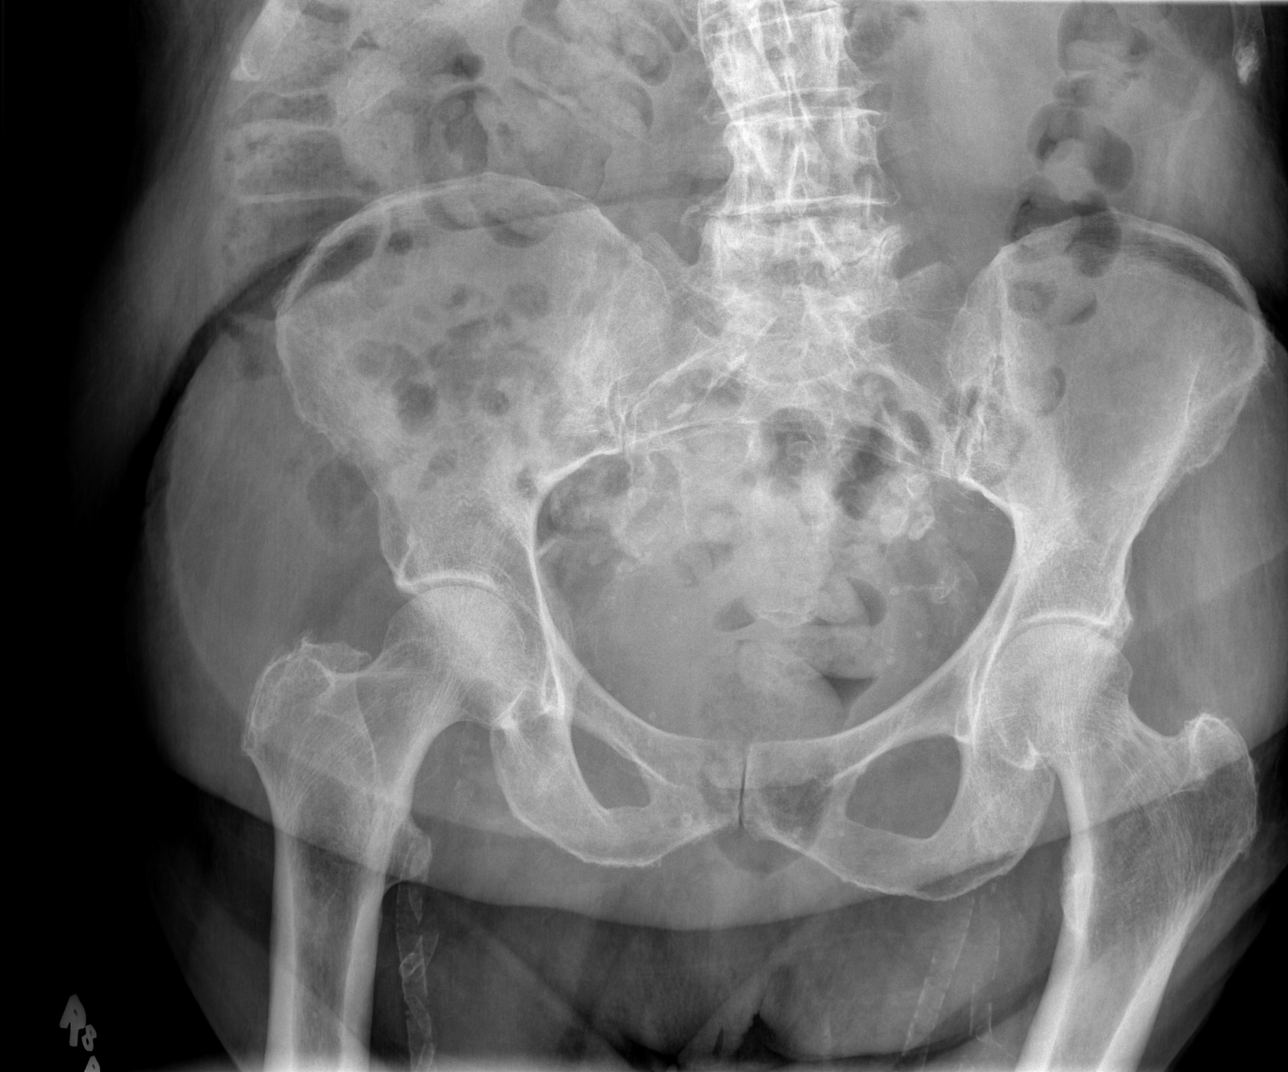

[t hip ap left]
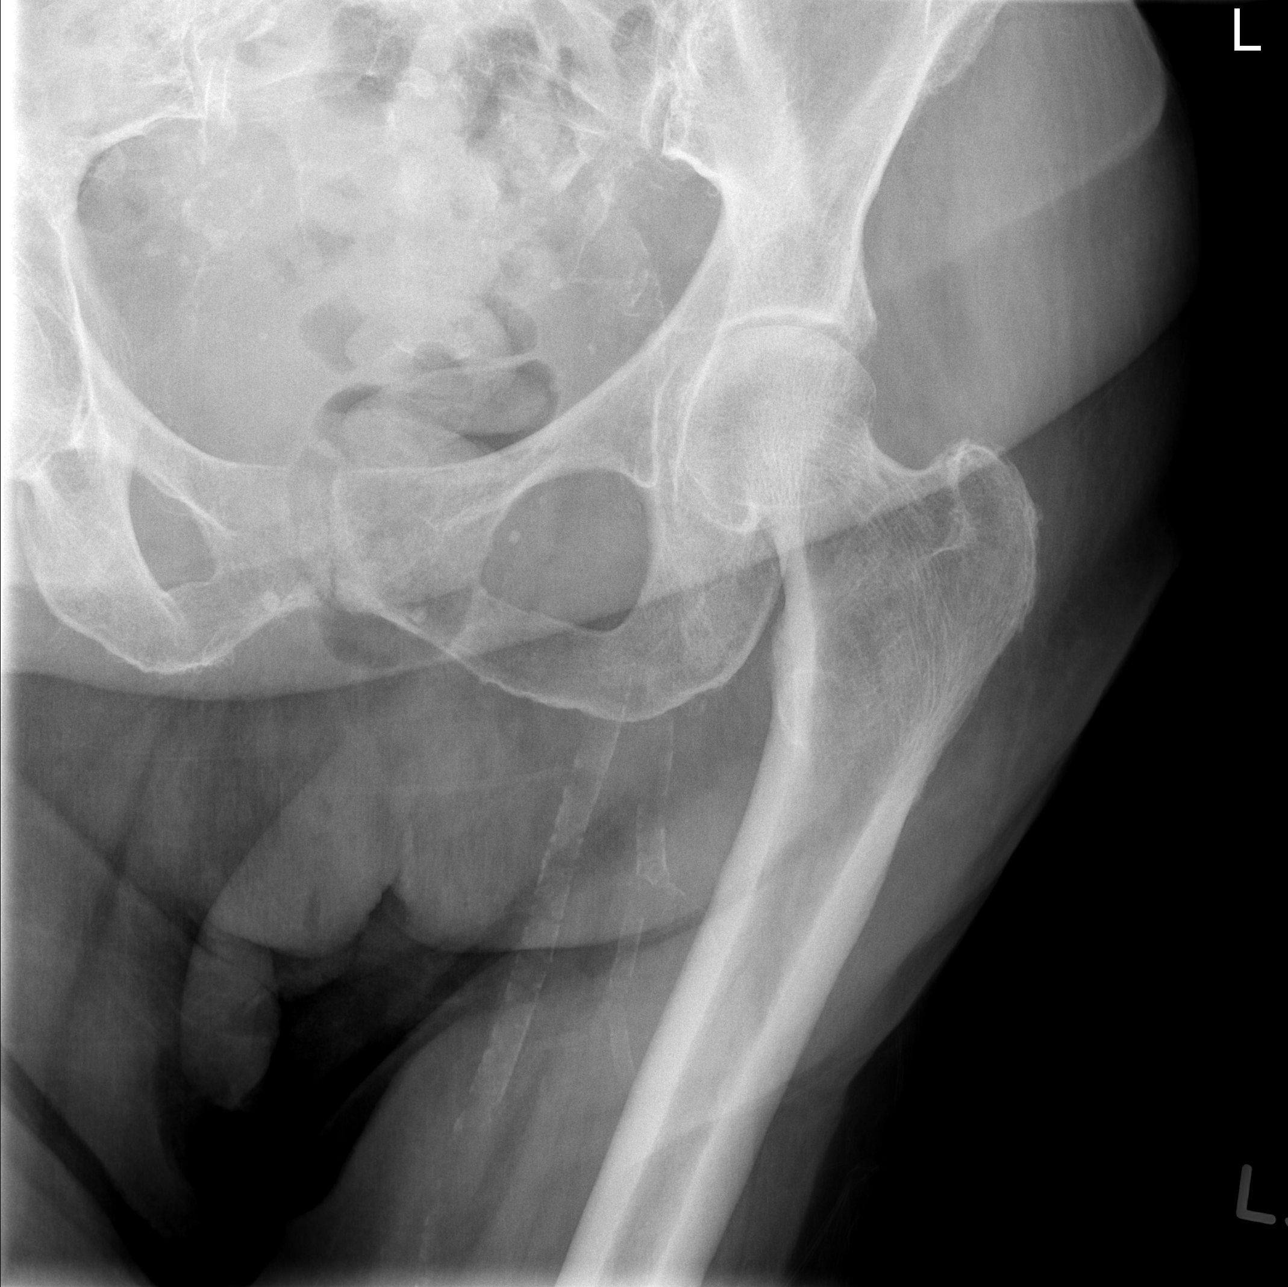

[t hip frog leg left]
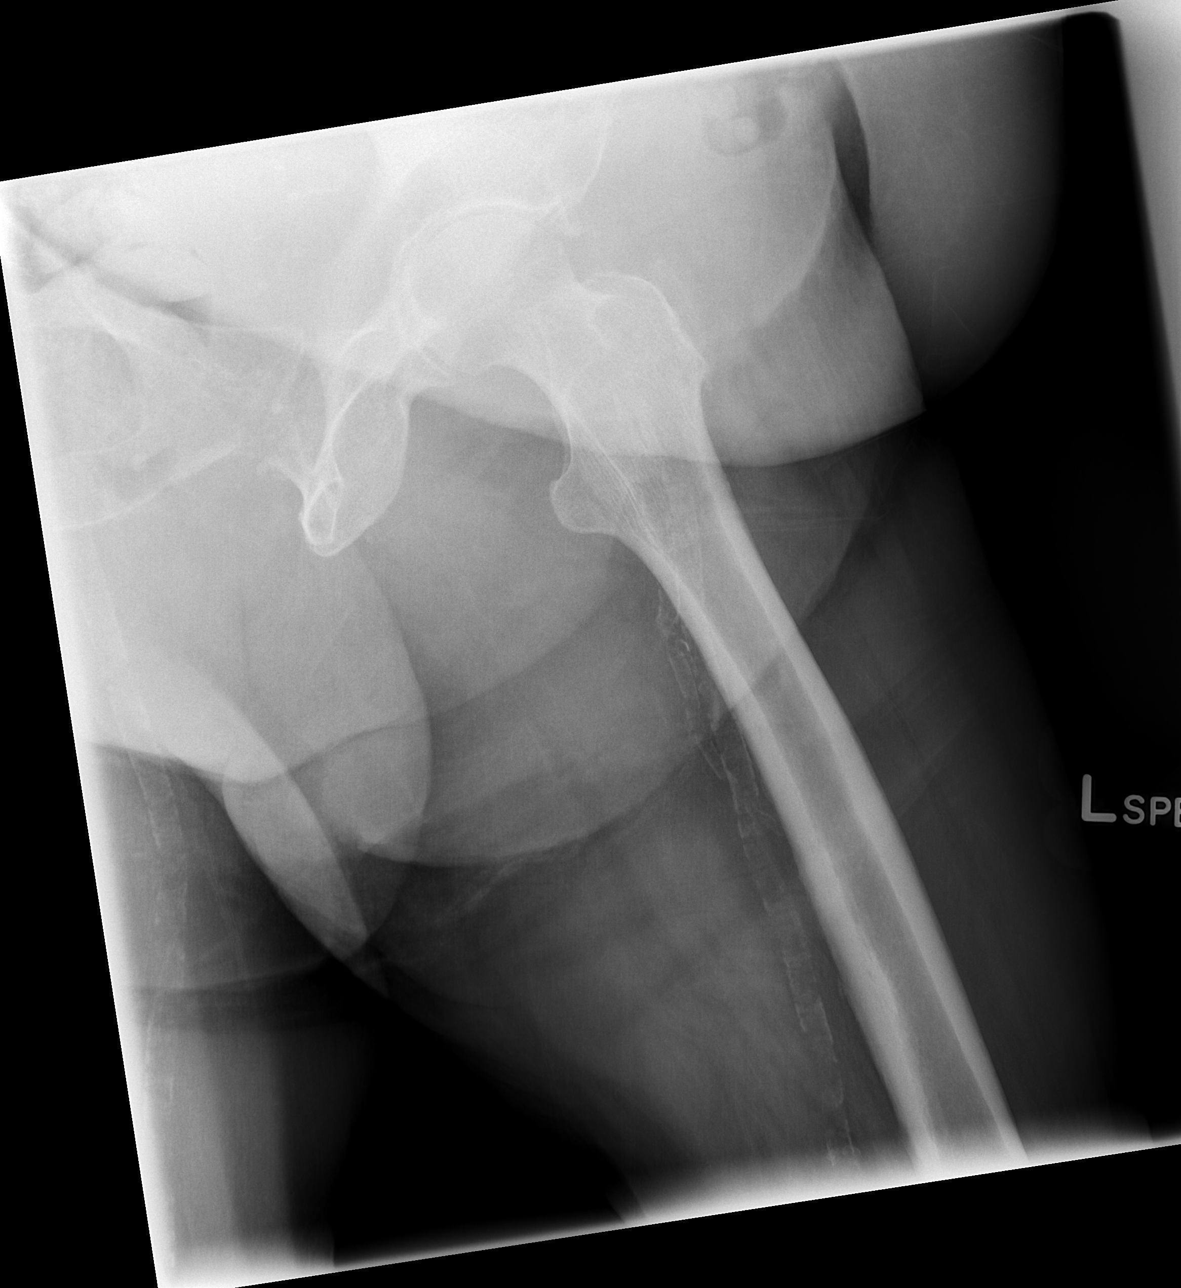

[3 of 3 positions shown; findings below may reference images not displayed]

FINDINGS: Mild symmetric degenerative changes in the hips bilaterally. No
acute bony abnormality. Specifically, no fracture, subluxation, or
dislocation. Soft tissues are intact.
IMPRESSION: No acute bony abnormality.
